# Patient Record
Sex: Female | Born: 1995 | Race: White | Hispanic: No | Marital: Single | Smoking: Never smoker
Health system: Southern US, Community
[De-identification: ages and names within clinical notes are randomized; demographics above are authoritative.]

## PROBLEM LIST (undated history)

## (undated) HISTORY — PX: APPENDECTOMY: SHX54

---

## 2014-04-19 ENCOUNTER — Encounter (HOSPITAL_COMMUNITY): Payer: Self-pay | Admitting: Emergency Medicine

## 2014-04-19 ENCOUNTER — Emergency Department (HOSPITAL_COMMUNITY)
Admission: EM | Admit: 2014-04-19 | Discharge: 2014-04-19 | Disposition: A | Discharge status: Home / Self Care | Payer: BC Managed Care – PPO | Attending: Emergency Medicine | Admitting: Emergency Medicine

## 2014-04-19 ENCOUNTER — Emergency Department (HOSPITAL_COMMUNITY): Payer: BC Managed Care – PPO

## 2014-04-19 DIAGNOSIS — S3981XA Other specified injuries of abdomen, initial encounter: Secondary | ICD-10-CM | POA: Diagnosis present

## 2014-04-19 DIAGNOSIS — Y9239 Other specified sports and athletic area as the place of occurrence of the external cause: Secondary | ICD-10-CM | POA: Diagnosis not present

## 2014-04-19 DIAGNOSIS — Y9373 Activity, racquet and hand sports: Secondary | ICD-10-CM | POA: Diagnosis not present

## 2014-04-19 DIAGNOSIS — IMO0002 Reserved for concepts with insufficient information to code with codable children: Secondary | ICD-10-CM | POA: Diagnosis not present

## 2014-04-19 DIAGNOSIS — X500XXA Overexertion from strenuous movement or load, initial encounter: Secondary | ICD-10-CM | POA: Insufficient documentation

## 2014-04-19 DIAGNOSIS — Z79899 Other long term (current) drug therapy: Secondary | ICD-10-CM | POA: Insufficient documentation

## 2014-04-19 DIAGNOSIS — T148XXA Other injury of unspecified body region, initial encounter: Secondary | ICD-10-CM

## 2014-04-19 DIAGNOSIS — Y92838 Other recreation area as the place of occurrence of the external cause: Secondary | ICD-10-CM | POA: Diagnosis not present

## 2014-04-19 MED ORDER — HYDROCODONE-ACETAMINOPHEN 5-325 MG PO TABS: 1.0000 | ORAL_TABLET | Freq: Four times a day (QID) | ORAL | Status: DC | PRN

## 2014-04-19 MED ORDER — ORPHENADRINE CITRATE ER 100 MG PO TB12: 100.0000 mg | ORAL_TABLET | Freq: Two times a day (BID) | ORAL | Status: DC

## 2014-04-19 MED ORDER — DIAZEPAM 5 MG PO TABS
5.0000 mg | ORAL_TABLET | Freq: Once | ORAL | Status: AC
  Administered 2014-04-19: 5 mg via ORAL
  Filled 2014-04-19: qty 1

## 2014-04-19 MED ORDER — HYDROCODONE-ACETAMINOPHEN 5-325 MG PO TABS
1.0000 | ORAL_TABLET | Freq: Once | ORAL | Status: AC
  Administered 2014-04-19: 1 via ORAL
  Filled 2014-04-19: qty 1

## 2014-04-19 NOTE — Discharge Instructions (Signed)
Muscle Strain A muscle strain (pulled muscle) happens when a muscle is stretched beyond normal length. It happens when a sudden, violent force stretches your muscle too far. Usually, a few of the fibers in your muscle are torn. Muscle strain is common in athletes. Recovery usually takes 1-2 weeks. Complete healing takes 5-6 weeks.  HOME CARE   Follow the PRICE method of treatment to help your injury get better. Do this the first 2-3 days after the injury:  Protect. Protect the muscle to keep it from getting injured again.  Rest. Limit your activity and rest the injured body part.  Ice. Put ice in a plastic bag. Place a towel between your skin and the bag. Then, apply the ice and leave it on from 15-20 minutes each hour. After the third day, switch to moist heat packs.  Compression. Use a splint or elastic bandage on the injured area for comfort. Do not put it on too tightly.  Elevate. Keep the injured body part above the level of your heart.  Only take medicine as told by your doctor.  Warm up before doing exercise to prevent future muscle strains. GET HELP IF:   You have more pain or puffiness (swelling) in the injured area.  You feel numbness, tingling, or notice a loss of strength in the injured area. MAKE SURE YOU:   Understand these instructions.  Will watch your condition.  Will get help right away if you are not doing well or get worse. Document Released: 05/22/2008 Document Revised: 06/03/2013 Document Reviewed: 03/12/2013 [facility] Patient Information 2015 [place], [place]. This information is not intended to replace advice given to you by your health care provider. Make sure you discuss any questions you have with your health care provider. As discussed.  Her x-ray was normal, not revealing any lung injury, or fracture of the ribs, you most likely have a severe.  Muscle strain.  U. been sent home with prescriptions for pain control, as well as a muscle relaxer.  Please try to  take this on a regular basis for the next week if you develop new or worsening symptoms such as fever, cough, shortness of breath.  Please return for further evaluation

## 2014-04-19 NOTE — ED Notes (Signed)
Pt arrived to the ED with a complaint of right sided flank pain.  Pt states she felt an injury a week ago playing tennis.  Pt states she went to a chiropractor on Wednesday but obtained no relief.  Pt states pain radiates to there back and the pain causes her to have painful breathing

## 2014-04-19 NOTE — ED Provider Notes (Signed)
Medical screening examination/treatment/procedure(s) were performed by non-physician practitioner and as supervising physician I was immediately available for consultation/collaboration.   EKG Interpretation None        Loren Racer, MD 04/19/14 [phone]

## 2014-04-19 NOTE — ED Provider Notes (Signed)
CSN: [id number]     Arrival date & time 04/19/14  0003 History   First MD Initiated Contact with Patient 04/19/14 0051     Chief Complaint  Patient presents with  . Flank Pain     (Consider location/radiation/quality/duration/timing/severity/associated sxs/prior Treatment) HPI Comments: Is a 18 year old female, who was playing tennis last week, and she felt a pop in her mid back since that time.  She has seen a chiropractor, who stated her rib was out of place, and manipulated.  Her chest wall.  She initially felt better, but several hours later, developed worsening pain, and shortness of breath.  She's been taking over-the-counter ibuprofen, without relief of her pain.  She states the pain is now radiating to her anterior chest.  Lower rib area.  Denies any fever, cough  Patient is a 18 y.o. female presenting with flank pain. The history is provided by the patient.  Flank Pain This is a new problem. The current episode started in the past 7 days. The problem occurs constantly. The problem has been gradually worsening. Associated symptoms include chest pain. Pertinent negatives include no coughing, fever, nausea or weakness. The symptoms are aggravated by coughing, twisting and exertion. She has tried NSAIDs for the symptoms. The treatment provided no relief.    History reviewed. No pertinent past medical history. Past Surgical History  Procedure Laterality Date  . Appendectomy     History reviewed. No pertinent family history. History  Substance Use Topics  . Smoking status: Never Smoker   . Smokeless tobacco: Not on file  . Alcohol Use: Yes   OB History   Grav Para Term Preterm Abortions TAB SAB Ect Mult Living                 Review of Systems  Constitutional: Negative for fever.  Respiratory: Negative for cough and shortness of breath.   Cardiovascular: Positive for chest pain.  Gastrointestinal: Negative for nausea.  Genitourinary: Positive for flank pain.  Neurological:  Negative for weakness.  All other systems reviewed and are negative.     Allergies  Review of patient's allergies indicates no known allergies.  Home Medications   Prior to Admission medications   Medication Sig Start Date End Date Taking? Authorizing Provider  ibuprofen (ADVIL,MOTRIN) 200 MG tablet Take 800 mg by mouth every 6 (six) hours as needed for moderate pain.   Yes Historical Provider, MD  HYDROcodone-acetaminophen (NORCO/VICODIN) 5-325 MG per tablet Take 1 tablet by mouth every 6 (six) hours as needed for moderate pain. 04/19/14   Arman Filter, NP  orphenadrine (NORFLEX) 100 MG tablet Take 1 tablet (100 mg total) by mouth 2 (two) times daily. 04/19/14   Arman Filter, NP   BP 132/78  Pulse 65  Temp(Src) 98.1 F (36.7 C) (Oral)  Resp 20  Wt 149 lb (67.586 kg)  SpO2 99%  LMP 04/05/2014 Physical Exam  Nursing note and vitals reviewed. Constitutional: She is oriented to person, place, and time. She appears well-developed and well-nourished.  HENT:  Head: Normocephalic.  Eyes: Pupils are equal, round, and reactive to light.  Neck: Normal range of motion.  Cardiovascular: Normal rate and regular rhythm.   Pulmonary/Chest: Effort normal and breath sounds normal. She exhibits tenderness.  Symmetrical chest movement  Abdominal: Soft. She exhibits no distension. There is no tenderness.  Musculoskeletal: Normal range of motion.  Neurological: She is alert and oriented to person, place, and time.  Skin: Skin is warm and dry. No rash noted.  No erythema.    ED Course  Procedures (including critical care time) Labs Review Labs Reviewed - No data to display  Imaging Review Dg Chest 2 View  04/19/2014   CLINICAL DATA:  Anterior lower right rib pain.  No injury.  EXAM: CHEST  2 VIEW  COMPARISON:  None.  FINDINGS: The heart size and mediastinal contours are within normal limits. Both lungs are clear. The visualized skeletal structures are unremarkable.  IMPRESSION: No active  cardiopulmonary disease.   Electronically Signed   By: Burman Nieves M.D.   On: 04/19/2014 01:27     EKG Interpretation None      MDM   Final diagnoses:  Muscle strain         Arman Filter, NP 04/19/14 [phone]

## 2014-04-20 ENCOUNTER — Other Ambulatory Visit: Payer: Self-pay | Admitting: Physician Assistant

## 2014-04-20 DIAGNOSIS — R1011 Right upper quadrant pain: Secondary | ICD-10-CM

## 2014-04-23 ENCOUNTER — Ambulatory Visit
Admission: RE | Admit: 2014-04-23 | Discharge: 2014-04-23 | Disposition: A | Discharge status: Home / Self Care | Payer: BC Managed Care – PPO | Source: Ambulatory Visit | Attending: Physician Assistant | Admitting: Physician Assistant

## 2014-04-23 DIAGNOSIS — R1011 Right upper quadrant pain: Secondary | ICD-10-CM

## 2014-04-26 ENCOUNTER — Other Ambulatory Visit: Payer: BC Managed Care – PPO

## 2015-01-14 ENCOUNTER — Other Ambulatory Visit: Payer: Self-pay | Admitting: Orthopaedic Surgery

## 2015-01-14 DIAGNOSIS — M545 Low back pain: Secondary | ICD-10-CM

## 2015-07-13 ENCOUNTER — Other Ambulatory Visit: Payer: Self-pay | Admitting: Orthopedic Surgery

## 2015-07-13 DIAGNOSIS — M545 Low back pain: Secondary | ICD-10-CM

## 2015-07-27 ENCOUNTER — Ambulatory Visit
Admission: RE | Admit: 2015-07-27 | Discharge: 2015-07-27 | Disposition: A | Discharge status: Home / Self Care | Payer: BLUE CROSS/BLUE SHIELD | Source: Ambulatory Visit | Attending: Orthopedic Surgery | Admitting: Orthopedic Surgery

## 2015-07-28 ENCOUNTER — Inpatient Hospital Stay: Admission: RE | Admit: 2015-07-28 | Payer: Self-pay | Source: Ambulatory Visit

## 2016-10-08 ENCOUNTER — Ambulatory Visit (INDEPENDENT_AMBULATORY_CARE_PROVIDER_SITE_OTHER): Payer: Self-pay | Admitting: Orthopedic Surgery

## 2016-10-08 DIAGNOSIS — M25562 Pain in left knee: Secondary | ICD-10-CM

## 2016-10-09 NOTE — Progress Notes (Signed)
   Post-Op Visit Note   Patient: Dana James           Date of Birth: 03/10/1996           MRN: [id number] Visit Date: 10/08/2016 PCP: No primary care provider on file.   Assessment & Plan:  Chief Complaint: No chief complaint on file.  Visit Diagnoses:  1. Acute pain of left knee     Plan: Dana James is a 21 year old female with left knee pain.  She reports a clicking type pain in the anterior aspect of the knee which was going on for about a month and now it's moved to the posterior aspect of her knee at times.  She plays tennis.  She took Aleve last week which did not help much.  She states that is starting to interfere some with her movement.  On exam she has full active and passive range of motion of the left knee with no effusion.  Not much the way of joint line tenderness.  Collateral crucial ligaments are stable.  No real patellofemoral crepitus.  No other masses lymph adenopathy or skin changes noted in the left knee region.  No radiographs available for review.  Impression is left knee pain which may be overuse.  Meniscal pathology is possible but most of her pain is anterior.  Plan is to change her over to diclofenac taper for 2 weeks.  I'll see how she does this weekend and potentially see her on Monday for repeat evaluation in clinic.  We may consider possible injection at that time.  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  Could consider scanning the knee if her symptoms don't improve with nonoperative measures.  Follow-Up Instructions: Return if symptoms worsen or fail to improve.   Orders:  No orders of the defined types were placed in this encounter.  No orders of the defined types were placed in this encounter.   Imaging: No results found.  PMFS History: There are no active problems to display for this patient.  No past medical history on file.  No family history on file.  Past Surgical History:  Procedure Laterality Date  . APPENDECTOMY      Social History   Occupational History  . Not on file.   Social History Main Topics  . Smoking status: Never Smoker  . Smokeless tobacco: Not on file  . Alcohol use Yes  . Drug use: No  . Sexual activity: Yes

## 2016-10-15 ENCOUNTER — Ambulatory Visit (INDEPENDENT_AMBULATORY_CARE_PROVIDER_SITE_OTHER): Payer: BLUE CROSS/BLUE SHIELD

## 2016-10-15 ENCOUNTER — Encounter (INDEPENDENT_AMBULATORY_CARE_PROVIDER_SITE_OTHER): Payer: Self-pay

## 2016-10-15 ENCOUNTER — Ambulatory Visit (INDEPENDENT_AMBULATORY_CARE_PROVIDER_SITE_OTHER): Payer: BLUE CROSS/BLUE SHIELD | Admitting: Orthopedic Surgery

## 2016-10-15 ENCOUNTER — Encounter (INDEPENDENT_AMBULATORY_CARE_PROVIDER_SITE_OTHER): Payer: Self-pay | Admitting: Orthopedic Surgery

## 2016-10-15 DIAGNOSIS — M25562 Pain in left knee: Secondary | ICD-10-CM

## 2016-10-15 DIAGNOSIS — G8929 Other chronic pain: Secondary | ICD-10-CM

## 2016-10-15 NOTE — Progress Notes (Signed)
Office Visit Note   Patient: Joaquim NamCorinne Dimaio           Date of Birth: 02/28/1996           MRN: [id number] Visit Date: 10/15/2016 Requested by: No referring provider defined for this encounter. PCP: No primary care provider on file.  Subjective: Chief Complaint  Patient presents with  . 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  She plays a high-level tennis for [employer].  She's been having some left knee pain where she wakes up and sore and stiff.  She has some occasional mechanical symptoms.  In the weight room she can do a leg press into box jumps but lunges are difficult for her.  It is been off and on for a year but worse over the past month.  She doesn't really localize the pain discretely in one area but it's essentially all around the knee joint.  She feels like there is a click in the knee which she has to "unlock" and move before she can weight-bear.  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  She was on a diclofenac taper which has not helped.              Review of Systems All systems reviewed are negative as they relate to the chief complaint within the history of present illness.  Patient denies  fevers or chills.    Assessment & Plan: Visit Diagnoses:  1. Chronic pain of left knee     Plan: Impression is left knee pain with normal radiographs no effusion and stable collateral cruciate ligaments.  Could be chondral defect or meniscal pathology.  She is able to compete but still has some of the symptoms.  I like to try an injection today see if that helps she does not have another match for about 6 days.  If her symptoms persist MRI scanning will be the next step  Follow-Up Instructions: No Follow-up on file.   Orders:  Orders Placed This Encounter  Procedures  . XR KNEE 3 VIEW LEFT   No orders of the defined types were placed in this encounter.     Procedures: Large  Joint Inj Date/Time: 10/15/2016 2:36 PM Performed by: Cammy CopaEAN, SCOTT Maliki Gignac Authorized by: Cammy CopaEAN, SCOTT Samariyah Cowles   Consent Given by:  Patient Site marked: the procedure site was marked   Timeout: prior to procedure the correct patient, procedure, and site was verified   Indications:  Pain, joint swelling and diagnostic evaluation Location:  Knee Site:  L knee Prep: patient was prepped and draped in usual sterile fashion   Needle Size:  18 G Needle Length:  1.5 inches Approach:  Superolateral Ultrasound Guidance: No   Fluoroscopic Guidance: No   Arthrogram: No   Medications:  5 mL lidocaine 1 %; 4 mL bupivacaine 0.25 %; 40 mg methylPREDNISolone acetate 40 MG/ML Patient tolerance:  Patient tolerated the procedure well with no immediate complications     Clinical Data: No additional findings.  Objective: Vital Signs: There were no vitals taken for this visit.  Physical Exam   Constitutional: Patient appears well-developed HEENT:  Head: Normocephalic Eyes:EOM are normal Neck: Normal range of motion Cardiovascular: Normal rate Pulmonary/chest: Effort normal Neurologic: Patient is alert Skin: Skin is warm Psychiatric: Patient has normal mood and affect    Ortho Exam orthopedic exam demonstrates full  active and passive range of motion of the left knee.  Collateral cruciate ligaments are stable.  There is no focal joint line tenderness.  Negative McMurray compression testing medially and laterally.  Pedal pulses palpable.  No groin pain with internal/external rotation of the leg.  Specialty Comments:  No specialty comments available.  Imaging: Xr Knee 3 View Left  Result Date: 10/15/2016 3 views left knee reviewed AP lateral merchant view.  Bony alignment is normal.  No fractures present.  No effusion in the knee present.  The bony abnormalities noted.  No soft tissue calcifications around the joint.    PMFS History: Patient Active Problem List   Diagnosis Date Noted  .  Chronic pain of left knee 10/15/2016   No past medical history on file.  No family history on file.  Past Surgical History:  Procedure Laterality Date  . APPENDECTOMY     Social History   Occupational History  . Not on file.   Social History Main Topics  . Smoking status: Never Smoker  . Smokeless tobacco: Never Used  . Alcohol use Yes  . Drug use: No  . Sexual activity: Yes

## 2016-10-17 ENCOUNTER — Encounter (INDEPENDENT_AMBULATORY_CARE_PROVIDER_SITE_OTHER): Payer: Self-pay | Admitting: Orthopedic Surgery

## 2016-10-17 MED ORDER — METHYLPREDNISOLONE ACETATE 40 MG/ML IJ SUSP
40.0000 mg | INTRAMUSCULAR | Status: AC | PRN
  Administered 2016-10-15: 40 mg via INTRA_ARTICULAR

## 2016-10-17 MED ORDER — BUPIVACAINE HCL 0.25 % IJ SOLN
4.0000 mL | INTRAMUSCULAR | Status: AC | PRN
  Administered 2016-10-15: 4 mL via INTRA_ARTICULAR

## 2016-10-17 MED ORDER — LIDOCAINE HCL 1 % IJ SOLN
5.0000 mL | INTRAMUSCULAR | Status: AC | PRN
  Administered 2016-10-15: 5 mL

## 2017-06-17 IMAGING — MR MR LUMBAR SPINE W/O CM
4 of 5 series · 23 of 48 positions shown · non-contrast
Comparison: None.

CLINICAL DATA: Initial valuation for acute back pain for 6 months
after playing tennis. History of prior back injury in 2182. Patient
now with pain in low back. Radiating into both legs.

EXAM:
MRI LUMBAR SPINE WITHOUT CONTRAST
TECHNIQUE: Multiplanar, multisequence MR imaging of the lumbar spine was
performed. No intravenous contrast was administered.

[Series 6: T2 · sagittal · 4.0mm · 0.73mm/px · 6 of 15 slices shown (1 of 2)]
[im 1/15]
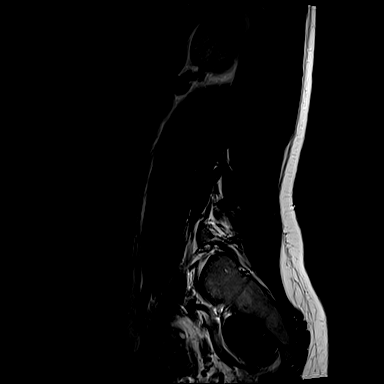
[im 3/15]
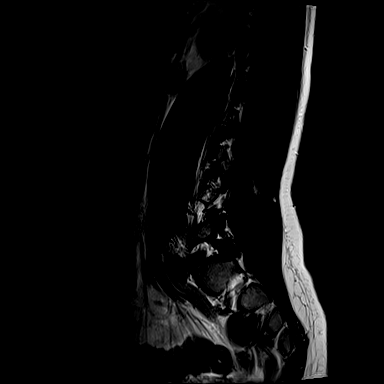
[im 6/15]
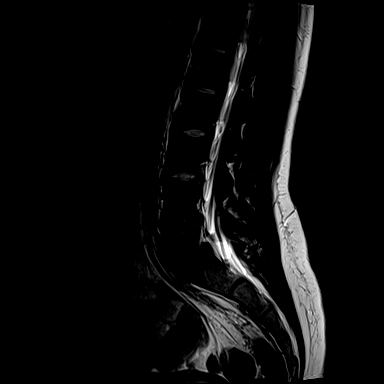
[im 9/15]
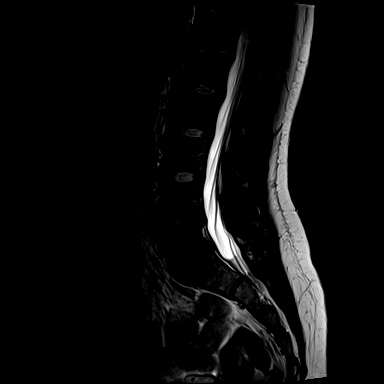
[im 12/15]
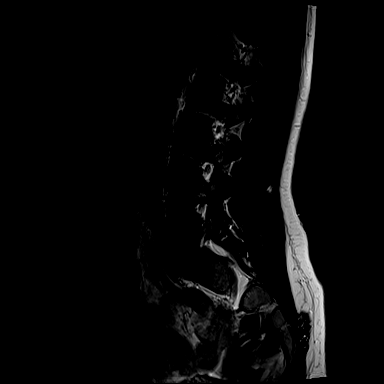
[im 15/15]
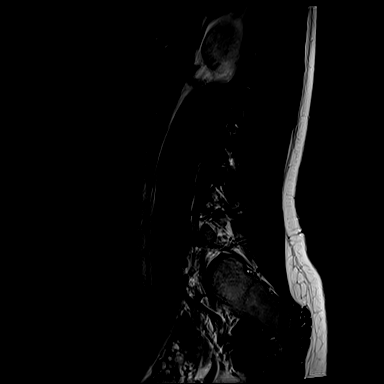

[Series 8: T1 · sagittal · 4.0mm · 0.88mm/px · 6 of 15 slices shown (1 of 2)]
[im 1/15]
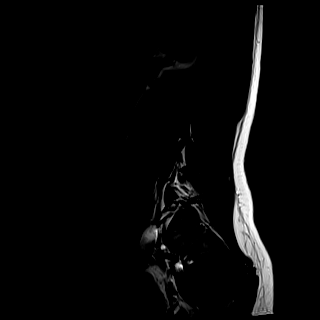
[im 3/15]
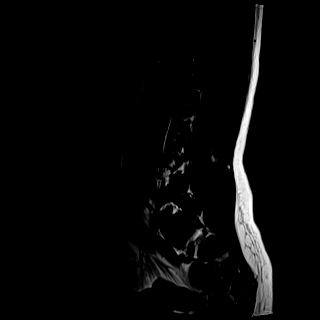
[im 5/15]
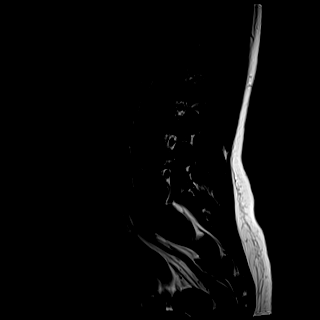
[im 8/15]
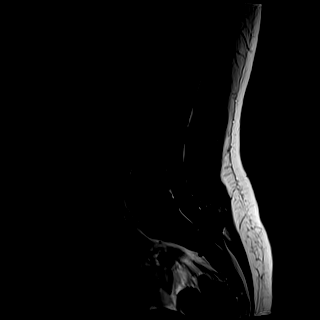
[im 10/15]
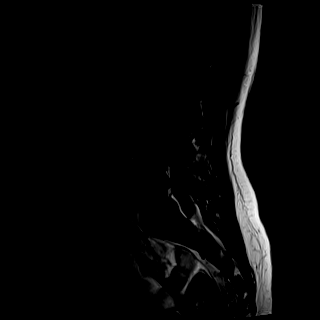
[im 12/15]
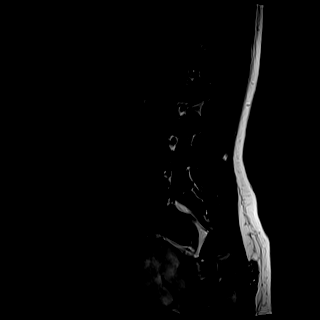

[Series 11: T2 · axial · 4.0mm · 0.28mm/px · z∈[-39,+138]mm · 8 of 32 slices shown (2 of 2)]
[im 1/32]
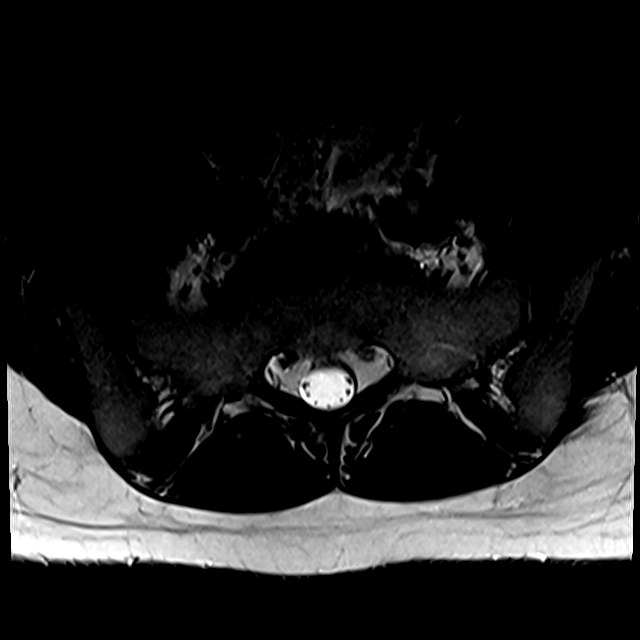
[im 5/32]
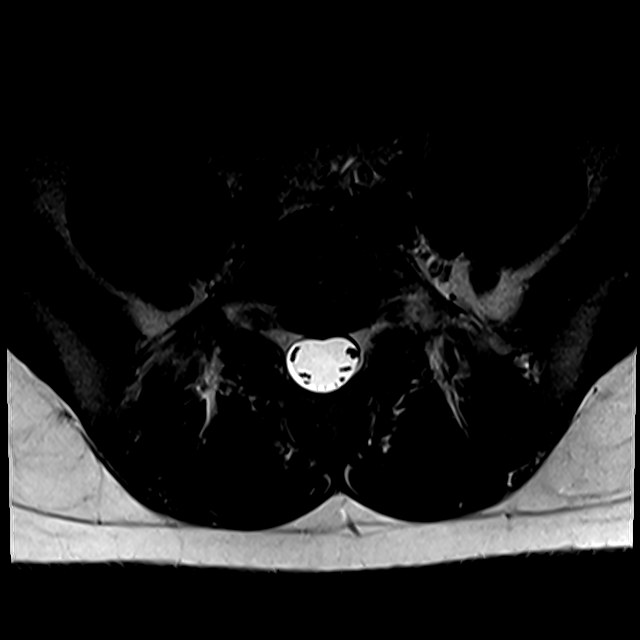
[im 10/32]
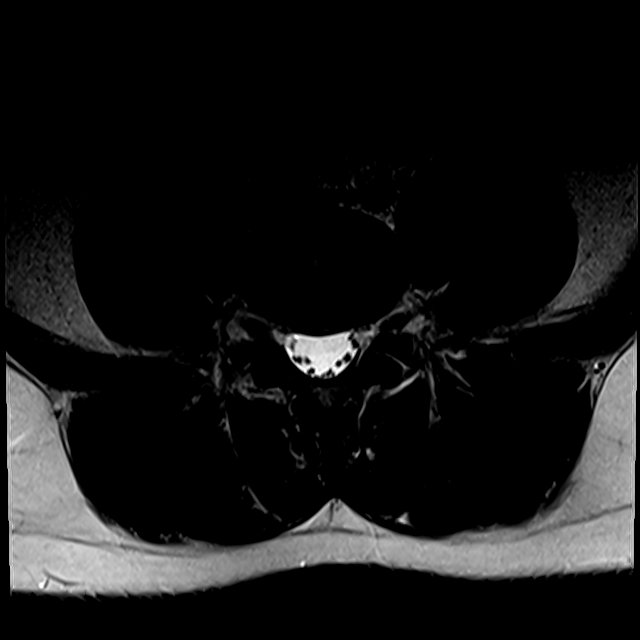
[im 15/32]
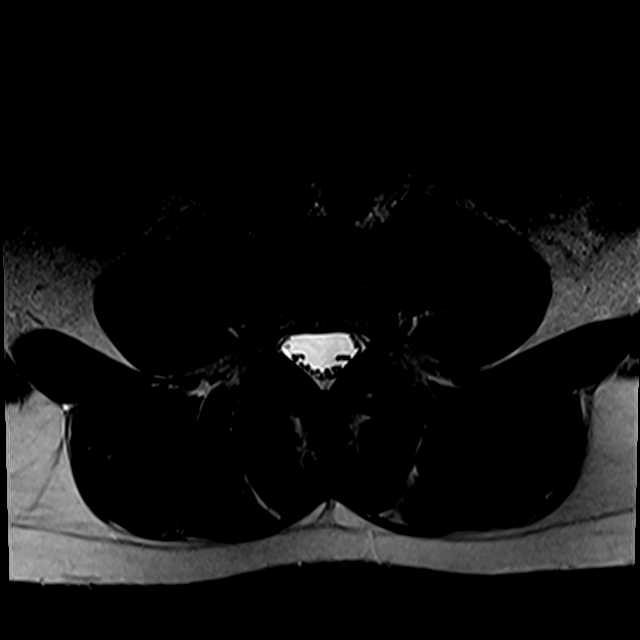
[im 17/32]
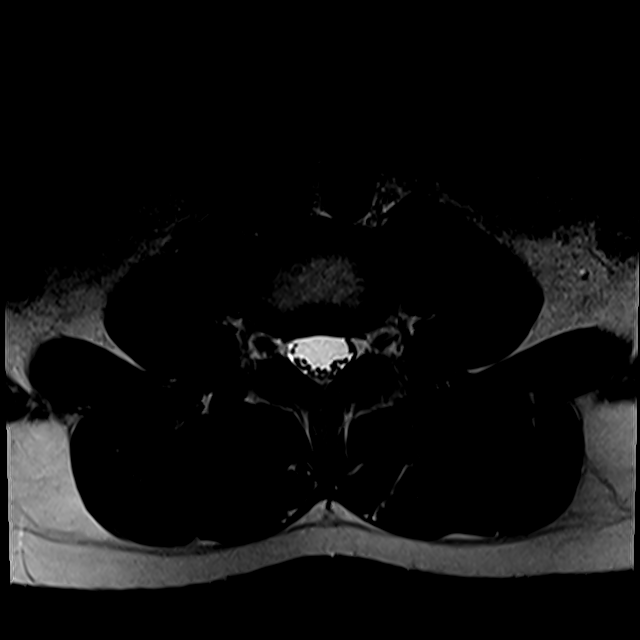
[im 22/32]
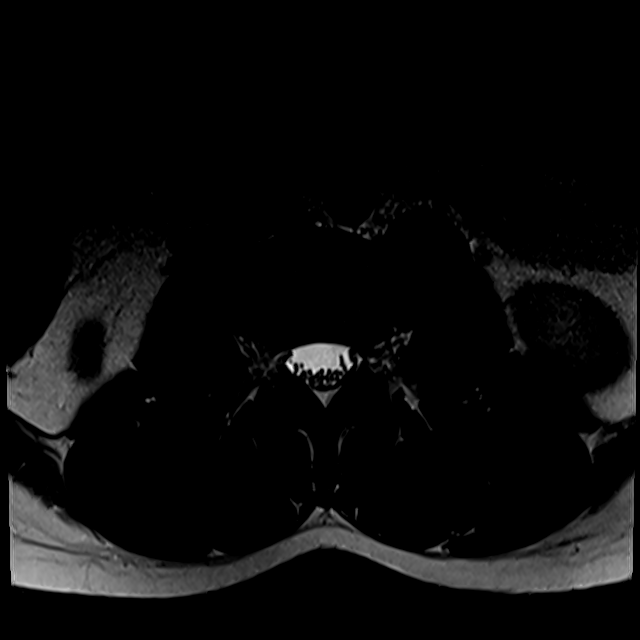
[im 27/32]
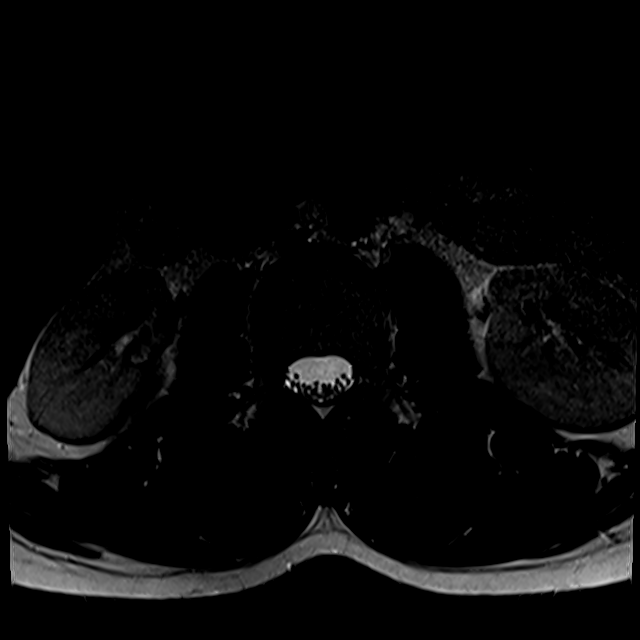
[im 32/32]
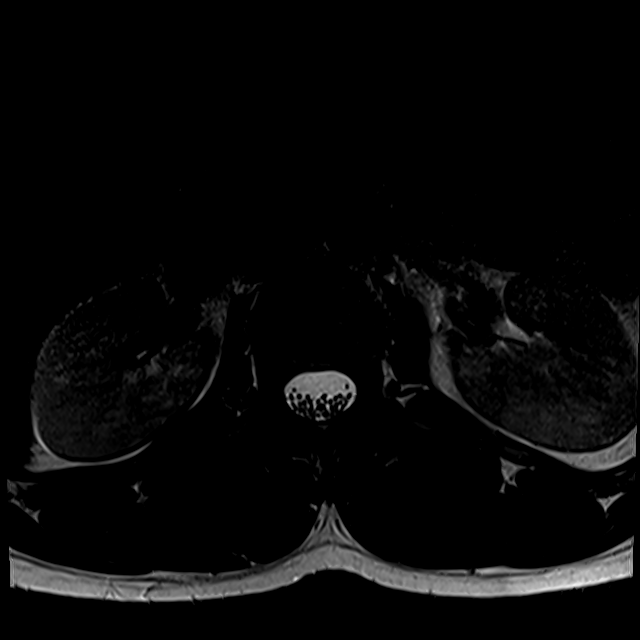

[Series 14: T1 · axial · 4.0mm · 0.56mm/px · z∈[-21,+113]mm · 3 of 32 slices shown (2 of 2)]
[im 5/32]
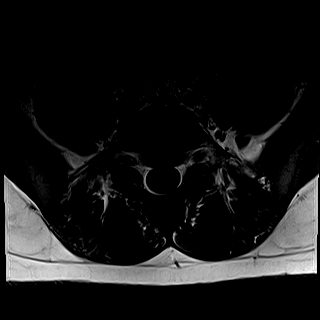
[im 17/32]
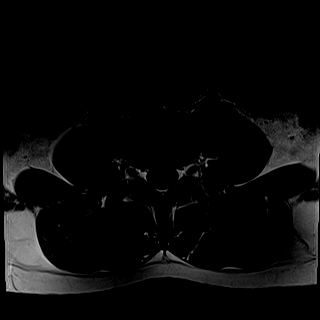
[im 27/32]
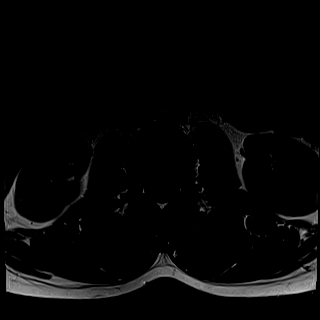

[23 of 48 positions shown; findings below may reference images not displayed]

FINDINGS: For the purposes of this dictation, the lowest well-formed
intervertebral disc spaces presumed to be the L5-S1 level, and there
presumed to be 5 lumbar type vertebral bodies.

3 mm anterolisthesis of L5 on S1 is present. Chronic bilateral pars
defects are present at L5. Vertebral bodies are otherwise normally
aligned with preservation of the normal lumbar lordosis. Vertebral
body heights are well preserved. Signal intensity within the
vertebral body bone marrow is normal. No fracture. No marrow edema.

Conus medullaris terminates normally at the L1 level. Signal
intensity within the visualized cord is normal. Nerve roots of the
cauda equina are normal in appearance.

Paraspinous soft tissues demonstrate no acute abnormality. 16 mm T2
hyperintense cysts noted within the lower pole the right kidney.

At T11-12 thru L3-4, there are no significant degenerative findings.
No canal or foraminal stenosis.

L4-5: Broad central disc protrusion minimally with associated
annular fissure. The bulging disc measures approximately 3 mm in AP
diameter. No significant canal stenosis or evidence of neural
impingement. Foramina remain widely patent.

L5-S1: 3 mm anterolisthesis of L5 on S1 with chronic bilateral pars
defects. There is associated disc bulging with approximately 5 mm of
uncovering of the posterior disc. No significant canal or foraminal
stenosis.
IMPRESSION: 1. Chronic bilateral pars defects at L5 with associated 3 mm
spondylolisthesis and associated disc bulge. No significant canal or
foraminal stenosis.
2. Shallow central disc protrusion at L4-5 without stenosis or
neural impingement.
